# Patient Record
Sex: Female | Born: 1980 | Race: White | Hispanic: No | Marital: Single
Health system: Midwestern US, Community
[De-identification: ages and names within clinical notes are randomized; demographics above are authoritative.]

---

## 2007-09-23 IMAGING — CR DG CHEST 2V
2 series · 2 of 2 positions shown · non-contrast
Comparison: none

CLINICAL DATA: Chest pain and shortness of breath.  Cough.
 CHEST - 2 VIEW:

[view not recorded (1 of 2)]
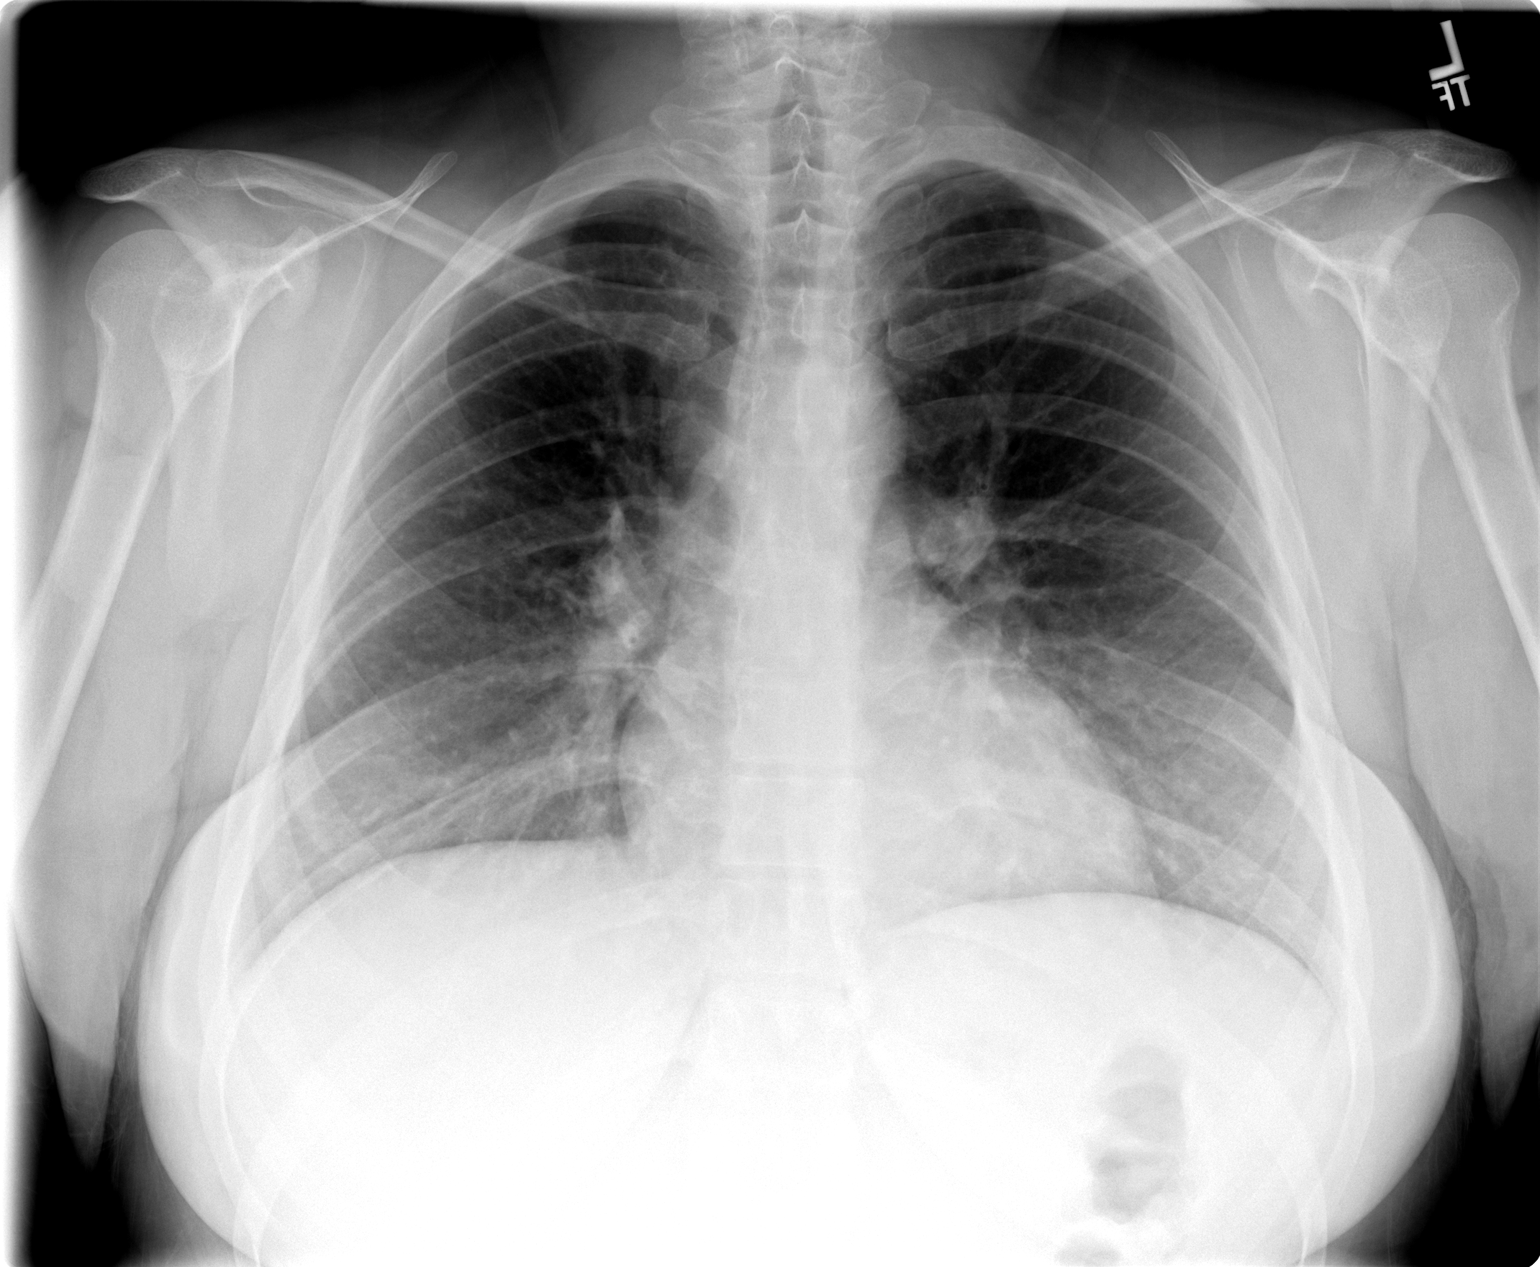

[view not recorded (2 of 2)]
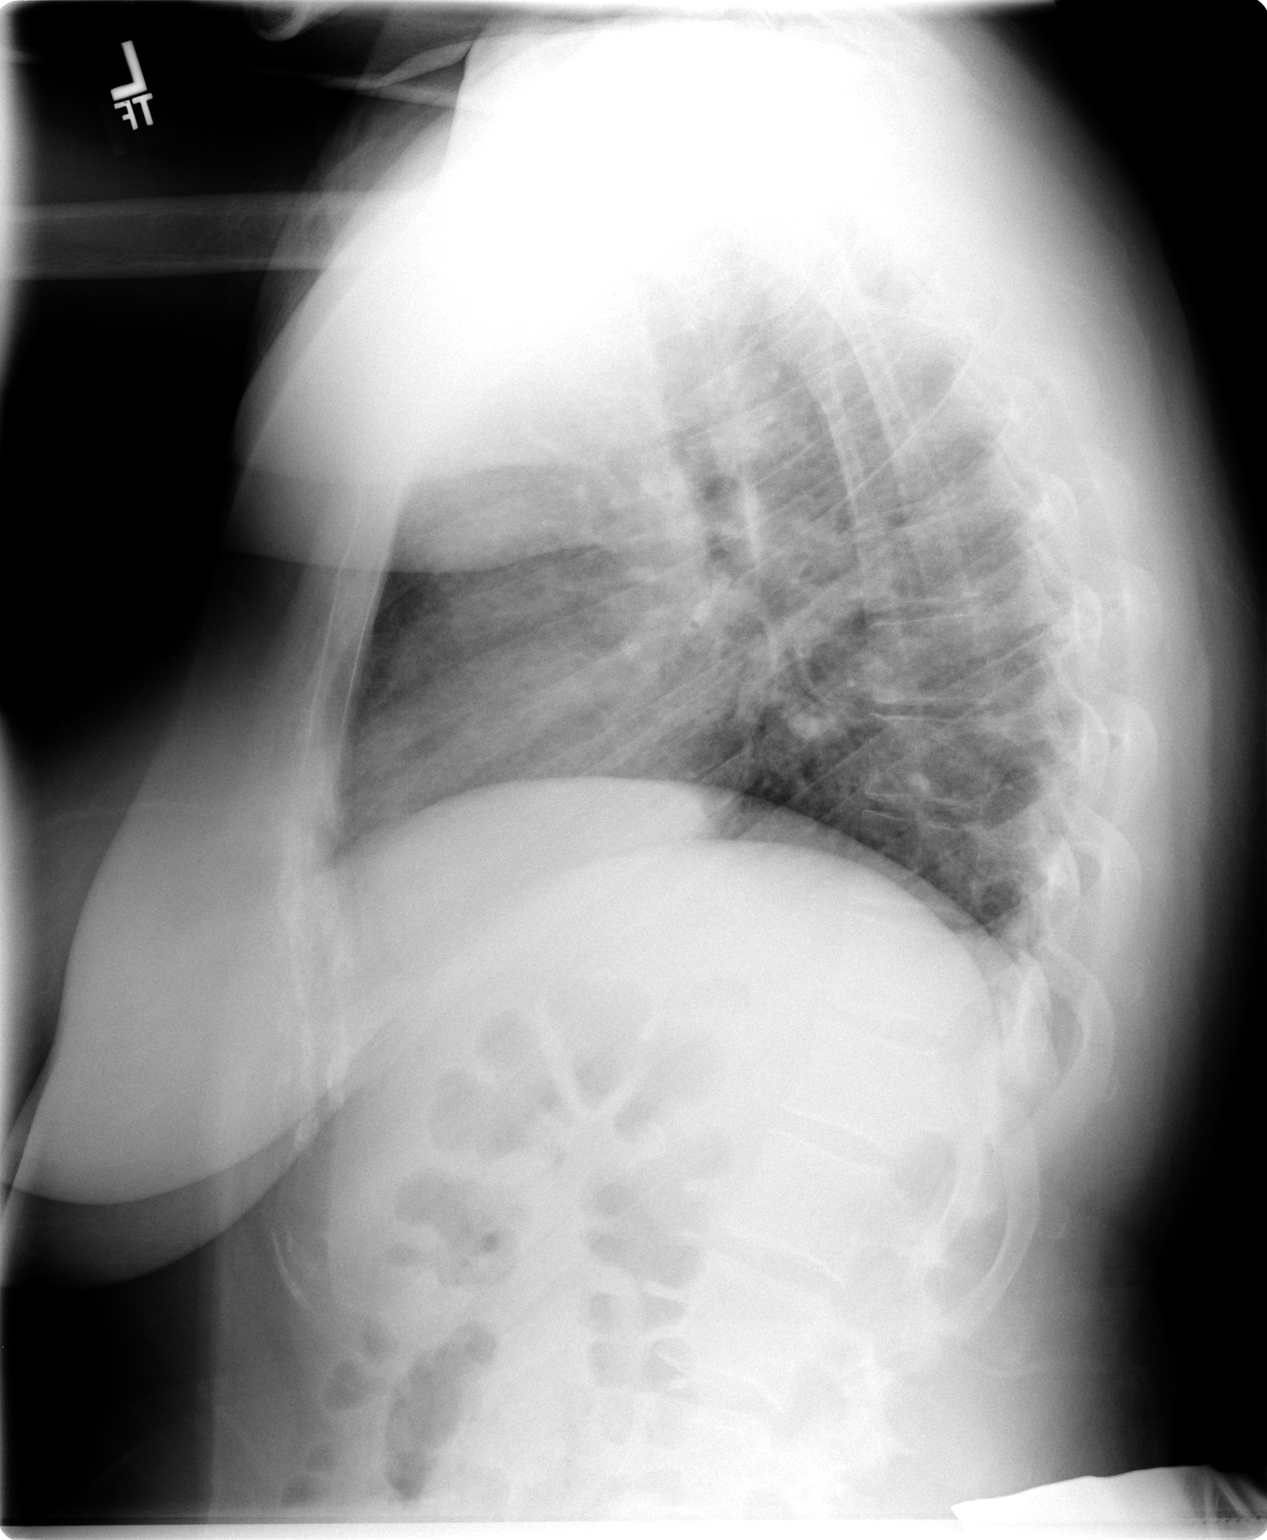

[2 of 2 positions shown; findings below may reference images not displayed]

FINDINGS: The heart size and mediastinal contours are within normal limits.  Both lungs are clear.  The visualized skeletal structures are within normal limits.
IMPRESSION: No active cardiopulmonary disease.

## 2020-08-21 DIAGNOSIS — J029 Acute pharyngitis, unspecified: Secondary | ICD-10-CM

## 2020-08-21 NOTE — Other (Unsigned)
Patient Acct Nbr: [id number]   Primary AUTH/CERT:   Primary Insurance Company Name: [employer]  Primary Insurance Plan name: [facility]  Primary Insurance Group Number:   Primary Insurance Plan Type: Health  Primary Insurance Policy Number: [id number]

## 2020-08-21 NOTE — Other (Signed)
Results reviewed by Verne Carrow MD. Patient's throat culture was positive for strep. He should be called in prescription for penicillin 500 mg, distribute 30 tabs, 1 tab po tid x 10 days

## 2020-08-21 NOTE — ED Provider Notes (Signed)
[facility] ED  eMERGENCY dEPARTMENT eNCOUnter      Pt Name: Bonnie Barrett  MRN: [id number]  Birthdate [birth year]  Date of evaluation: 08/21/2020  Provider: Arnetha Massy, PA-C     CHIEF COMPLAINT       Chief Complaint   Patient presents with   ??? Pharyngitis   ??? Fatigue     Patient seen independently with an Emergency Medicine attending available for supervision.    HISTORY OF PRESENT ILLNESS   (Location/Symptom, Timing/Onset,Context/Setting, Quality, Duration, Modifying Factors, Severity) Note limiting factors.   HPI    Bonnie Barrett is a 40 y.o. female who presents to the emergency department for sore throat she has had for 3 days.  Patient rates pain at a 10 out of 10 located at her throat.  Patient states she has pain with swallowing and talking.  Patient denies any trouble swallowing food or handling her secretions.  Patient denies any contacts with persons with strep.  Patient denies having a sore throat like this in the past.  Patient has tried cold and flu medication, throat lozenges, hot tea and honey with no relief.  Patient also noting pain with touching of her anterior neck.  Patient denies any fevers or chills.  Patient denies any chest pain or shortness of breath.  Patient denies any abdominal pain, constipation, diarrhea.  Patient denies any urinary frequency, urgency, hematuria.    Nursing Notes were reviewed.    REVIEW OFSYSTEMS    (2+ for level 4; 10+ for level 5)   Review of Systems   Constitutional: Negative for chills and fever.   HENT: Positive for sore throat (Sore throat for the past 3 days), trouble swallowing (Secondary to pain) and voice change (Hoarse). Negative for congestion, ear pain, postnasal drip and rhinorrhea.    Respiratory: Negative for cough, chest tightness and shortness of breath.    Cardiovascular: Negative for chest pain.   Gastrointestinal: Negative for abdominal pain, constipation, diarrhea, nausea and vomiting.   Genitourinary: Negative for difficulty urinating and  dysuria.   Musculoskeletal: Positive for neck pain (Anterior neck pain tenderness). Negative for arthralgias and neck stiffness.   Skin: Negative for rash.   Neurological: Negative for numbness and headaches.          PAST MEDICAL HISTORY   History reviewed. No pertinent past medical history.    SURGICAL HISTORY     History reviewed. No pertinent surgical history.    CURRENT MEDICATIONS       There are no discharge medications for this patient.      ALLERGIES     Naproxen    FAMILY HISTORY     History reviewed. No pertinent family history.     SOCIAL HISTORY       Social History     Socioeconomic History   ??? Marital status: Single     Spouse name: None   ??? Number of children: None   ??? Years of education: None   ??? Highest education level: None   Occupational History   ??? None   Tobacco Use   ??? Smoking status: Current Every Day Smoker     Packs/day: 0.50     Types: Cigarettes   ??? Smokeless tobacco: Never Used   Substance and Sexual Activity   ??? Alcohol use: Never   ??? Drug use: Never   ??? Sexual activity: None   Other Topics Concern   ??? None   Social History Narrative   ??? None  Social Determinants of Health     Financial Resource Strain:    ??? Difficulty of Paying Living Expenses: Not on file   Food Insecurity:    ??? Worried About Programme researcher, broadcasting/film/video in the Last Year: Not on file   ??? Ran Out of Food in the Last Year: Not on file   Transportation Needs:    ??? Lack of Transportation (Medical): Not on file   ??? Lack of Transportation (Non-Medical): Not on file   Physical Activity:    ??? Days of Exercise per Week: Not on file   ??? Minutes of Exercise per Session: Not on file   Stress:    ??? Feeling of Stress : Not on file   Social Connections:    ??? Frequency of Communication with Friends and Family: Not on file   ??? Frequency of Social Gatherings with Friends and Family: Not on file   ??? Attends Religious Services: Not on file   ??? Active Member of Clubs or Organizations: Not on file   ??? Attends Banker Meetings: Not  on file   ??? Marital Status: Not on file   Intimate Partner Violence:    ??? Fear of Current or Ex-Partner: Not on file   ??? Emotionally Abused: Not on file   ??? Physically Abused: Not on file   ??? Sexually Abused: Not on file   Housing Stability:    ??? Unable to Pay for Housing in the Last Year: Not on file   ??? Number of Places Lived in the Last Year: Not on file   ??? Unstable Housing in the Last Year: Not on file       SCREENINGS           PHYSICAL EXAM    (up to 7 for level 4, 8 ormore for level 5)     ED Triage Vitals [08/21/20 2222]   BP Temp Temp Source Pulse Resp SpO2 Height Weight   124/70 97.2 ??F (36.2 ??C) Temporal 121 18 93 % -- --       Physical Exam  Vitals and nursing note reviewed.   Constitutional:       Appearance: She is well-developed. She is obese.      Comments: Patient appears uncomfortable sitting in chair   HENT:      Head: Normocephalic and atraumatic.      Right Ear: Tympanic membrane and ear canal normal.      Left Ear: Tympanic membrane and ear canal normal.      Nose: No congestion or rhinorrhea.      Mouth/Throat:      Mouth: Mucous membranes are moist. No oral lesions.      Pharynx: Uvula midline. Pharyngeal swelling and posterior oropharyngeal erythema present. No oropharyngeal exudate.      Tonsils: No tonsillar exudate.      Comments: Posterior oropharynx appears swollen and erythematous.  Tonsillar adenopathy present no exudate.  Uvula appears midline  Cardiovascular:      Rate and Rhythm: Regular rhythm. Tachycardia present.      Heart sounds: Normal heart sounds.   Pulmonary:      Effort: Pulmonary effort is normal.      Breath sounds: Normal breath sounds.   Abdominal:      Palpations: Abdomen is soft.   Musculoskeletal:      Cervical back: Normal range of motion.   Lymphadenopathy:      Cervical: Cervical adenopathy (Bilateral anterior cervical adenopathy) present.  Skin:     General: Skin is warm and dry.      Capillary Refill: Capillary refill takes less than 2 seconds.    Neurological:      General: No focal deficit present.      Mental Status: She is alert.   Psychiatric:         Mood and Affect: Mood normal.         Behavior: Behavior normal.          DIAGNOSTIC RESULTS       RADIOLOGY (Per Emergency Physician):        Interpretation per the Radiologist below, if available at the time of this note:  No results found.    ED BEDSIDE ULTRASOUND:   Performed by ED Physician - none    LABS:  Labs Reviewed   RAPID STREP SCREEN    Narrative:     Test Performed by [facility], [address],  [place], [place] [address]   GROUP A STREP SCREEN BY PCR   CBC WITH AUTO DIFFERENTIAL   BASIC METABOLIC PANEL   HCG, SERUM, QUALITATIVE        All otherlabs were within normal range or not returned as of this dictation.    EMERGENCY DEPARTMENT COURSE and DIFFERENTIAL DIAGNOSIS/MDM:   Vitals:    Vitals:    08/21/20 2222   BP: 124/70   Pulse: 121   Resp: 18   Temp: 97.2 ??F (36.2 ??C)   TempSrc: Temporal   SpO2: 93%       Medications   sodium chloride flush 0.9 % injection 3 mL (3 mLs IntraVENous Given 08/21/20 2254)   iopamidol (ISOVUE-370) 76 % injection 75 mL (has no administration in time range)       MDM.  In brief, Bonnie Barrett isa 40 y.o. female who presented to the emergency department complaining of a sore throat for the past 3 days.  Please see HPI    Old records were obtained in patient's EMR which revealed history of asthma    Upon examination, the patient did not appear to septic or toxic. No evidence of focal deficits or weakness.      Labs and imaging were ordered and reviewed. Patient's rapid strep screen negative, culture pending.  CBC, BMP, hCG qualitative ordered.  CT of the soft tissues of the neck with contrast ordered.    Patient eloped before lab work and CT scan.        REVAL:          CONSULTS:  None    PROCEDURES:  Unless otherwise noted below, none     Procedures    FINAL IMPRESSION      1. Acute pharyngitis, unspecified etiology        DISPOSITION/PLAN   DISPOSITION         PATIENT REFERRED TO:  No follow-up provider specified.    DISCHARGE MEDICATIONS:  There are no discharge medications for this patient.         (Please note:  Portions of this note were completed with a voice recognition program.  Efforts were made to edit thedictations but occasionally words and phrases are mis-transcribed.)  Form [id number]    Arnetha Massy, PA-C (electronically signed)  Emergency Medicine Provider       Arnetha Massy, PA-C  08/22/20 [phone]

## 2020-08-21 NOTE — ED Triage Notes (Signed)
Pt presents in ED with c/o severe sore throat and fatigue ongoing for 2 days. Denies fever or chills. Denies covid exposure.

## 2020-08-22 ENCOUNTER — Inpatient Hospital Stay: Admit: 2020-08-22 | Discharge: 2020-08-22 | Discharge status: Against Medical Advice

## 2020-08-22 LAB — RAPID STREP SCREEN

## 2020-08-22 LAB — GROUP A STREP SCREEN BY PCR: Group A Strep Screen By PCR: DETECTED — AB

## 2020-08-22 MED ORDER — NORMAL SALINE FLUSH 0.9 % IV SOLN
0.9 % | Freq: Three times a day (TID) | INTRAVENOUS | Status: DC
Start: 2020-08-22 — End: 2020-08-22
  Administered 2020-08-22: 04:00:00 via INTRAVENOUS

## 2020-08-22 MED ORDER — IOPAMIDOL 76 % IV SOLN
76 % | Freq: Once | INTRAVENOUS | Status: DC | PRN
Start: 2020-08-22 — End: 2020-08-22

## 2020-08-22 NOTE — ED Notes (Signed)
Multiple attempts made to insert new IV/ collect bloodwork unsuccessful. RN to attempt with [place]. C/o pain and pt tearful.      Ruta Hinds, RN  08/22/20 [phone]
